# Patient Record
Sex: Male | Born: 2000
Health system: Southern US, Community
[De-identification: ages and names within clinical notes are randomized; demographics above are authoritative.]

## PROBLEM LIST (undated history)

## (undated) DIAGNOSIS — J45909 Unspecified asthma, uncomplicated: Secondary | ICD-10-CM

---
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
## 2021-11-04 ENCOUNTER — Telehealth: Payer: Self-pay | Admitting: Emergency Medicine

## 2021-11-04 NOTE — Telephone Encounter (Signed)
Call to let [place] know that we do not do physicals at [facility]. Call dropped - unable to leave a message or talk to patient  ?

## 2021-11-05 ENCOUNTER — Ambulatory Visit: Payer: Self-pay
